# Patient Record
Sex: Male | Born: 1980 | Race: White | Hispanic: No | Marital: Married | Smoking: Never smoker
Health system: Southern US, Community
[De-identification: ages and names within clinical notes are randomized; demographics above are authoritative.]

## PROBLEM LIST (undated history)

## (undated) DIAGNOSIS — G43909 Migraine, unspecified, not intractable, without status migrainosus: Secondary | ICD-10-CM

## (undated) DIAGNOSIS — J45909 Unspecified asthma, uncomplicated: Secondary | ICD-10-CM

## (undated) HISTORY — PX: APPENDECTOMY: SHX54

## (undated) HISTORY — DX: Unspecified asthma, uncomplicated: J45.909

## (undated) HISTORY — PX: WISDOM TOOTH EXTRACTION: SHX21

## (undated) HISTORY — DX: Migraine, unspecified, not intractable, without status migrainosus: G43.909

## (undated) HISTORY — PX: HERNIA REPAIR: SHX51

---

## 2019-08-28 ENCOUNTER — Other Ambulatory Visit: Payer: Self-pay

## 2019-08-28 ENCOUNTER — Emergency Department
Admission: EM | Admit: 2019-08-28 | Discharge: 2019-08-28 | Disposition: A | Discharge status: Home / Self Care | Payer: BC Managed Care – PPO | Attending: Student | Admitting: Student

## 2019-08-28 ENCOUNTER — Emergency Department: Payer: BC Managed Care – PPO

## 2019-08-28 DIAGNOSIS — R0602 Shortness of breath: Secondary | ICD-10-CM

## 2019-08-28 DIAGNOSIS — R0781 Pleurodynia: Secondary | ICD-10-CM

## 2019-08-28 DIAGNOSIS — R0789 Other chest pain: Secondary | ICD-10-CM | POA: Diagnosis present

## 2019-08-28 LAB — CBC
HCT: 43.8 % (ref 39.0–52.0)
Hemoglobin: 16 g/dL (ref 13.0–17.0)
MCH: 32.7 pg (ref 26.0–34.0)
MCHC: 36.5 g/dL — ABNORMAL HIGH (ref 30.0–36.0)
MCV: 89.4 fL (ref 80.0–100.0)
Platelets: 321 10*3/uL (ref 150–400)
RBC: 4.9 MIL/uL (ref 4.22–5.81)
RDW: 12.4 % (ref 11.5–15.5)
WBC: 14 10*3/uL — ABNORMAL HIGH (ref 4.0–10.5)
nRBC: 0 % (ref 0.0–0.2)

## 2019-08-28 LAB — BASIC METABOLIC PANEL
Anion gap: 9 (ref 5–15)
BUN: 14 mg/dL (ref 6–20)
CO2: 24 mmol/L (ref 22–32)
Calcium: 9.2 mg/dL (ref 8.9–10.3)
Chloride: 103 mmol/L (ref 98–111)
Creatinine, Ser: 0.8 mg/dL (ref 0.61–1.24)
GFR calc Af Amer: >60 mL/min (ref >60)
GFR calc non Af Amer: >60 mL/min (ref >60)
Glucose, Bld: 118 mg/dL — ABNORMAL HIGH (ref 70–99)
Potassium: 3.9 mmol/L (ref 3.5–5.1)
Sodium: 136 mmol/L (ref 135–145)

## 2019-08-28 LAB — TROPONIN I (HIGH SENSITIVITY)
Troponin I (High Sensitivity): 3 ng/L (ref <18)
Troponin I (High Sensitivity): 3 ng/L (ref <18)

## 2019-08-28 LAB — FIBRIN DERIVATIVES D-DIMER (ARMC ONLY): Fibrin derivatives D-dimer (ARMC): 1029.59 ng/mL (FEU) — ABNORMAL HIGH (ref 0.00–499.00)

## 2019-08-28 MED ORDER — SODIUM CHLORIDE 0.9% FLUSH: 3.0000 mL | Freq: Once | INTRAVENOUS | Status: DC

## 2019-08-28 MED ORDER — KETOROLAC TROMETHAMINE 30 MG/ML IJ SOLN
INTRAMUSCULAR | Status: AC
  Administered 2019-08-28: 30 mg
  Filled 2019-08-28: qty 1

## 2019-08-28 MED ORDER — IOHEXOL 350 MG/ML SOLN
75.0000 mL | Freq: Once | INTRAVENOUS | Status: AC | PRN
  Administered 2019-08-28: 75 mL via INTRAVENOUS
  Filled 2019-08-28: qty 75

## 2019-08-28 MED ORDER — NAPROXEN 500 MG PO TABS: 500.0000 mg | ORAL_TABLET | Freq: Two times a day (BID) | ORAL | 0 refills | Status: AC

## 2019-08-28 MED ORDER — KETOROLAC TROMETHAMINE 15 MG/ML IJ SOLN
30.0000 mg | Freq: Once | INTRAMUSCULAR | Status: DC
  Filled 2019-08-28: qty 2

## 2019-08-28 MED ORDER — KETOROLAC TROMETHAMINE 15 MG/ML IJ SOLN
15.0000 mg | Freq: Once | INTRAMUSCULAR | Status: DC
  Filled 2019-08-28: qty 1

## 2019-08-28 NOTE — ED Provider Notes (Signed)
[facility] Emergency Department Provider Note  ____________________________________________   First MD Initiated Contact with Patient 08/28/19 1236     (approximate)  I have reviewed the triage vital signs and the nursing notes.  History  Chief Complaint Chest Pain    HPI Noah Mercado is a 38 y.o. male who presents to the emergency department for shortness of breath and pleuritic chest pain.  Patient was diagnosed with COVID on 10/19, completed his quarantine and felt he was doing well and improving.  Today, however, he developed chest pain with deep inspiration as well as shortness of breath. He reports the pain as sharp, moderate in severity, located across his chest, and exacerbated by a deep breath.  He denies any nausea, vomiting or diarrhea.  He denies any history of VTE.  He denies any history of HTN, HLD, DM.  He does not tobacco.   Past Medical Hx History reviewed. No pertinent past medical history.  Problem List There are no active problems to display for this patient.   Past Surgical Hx Past Surgical History:  Procedure Laterality Date  . APPENDECTOMY    . HERNIA REPAIR    . WISDOM TOOTH EXTRACTION      Medications Prior to Admission medications   Not on File    Allergies Patient has no known allergies.  Family Hx No family history on file.  Social Hx Social History   Tobacco Use  . Smoking status: Never Smoker  . Smokeless tobacco: Never Used  Substance Use Topics  . Alcohol use: Yes  . Drug use: Not Currently     Review of Systems  Constitutional: Negative for fever, chills. Eyes: Negative for visual changes. ENT: Negative for sore throat. Cardiovascular: + for chest pain. Respiratory: + for shortness of breath. Gastrointestinal: Negative for nausea, vomiting.  Genitourinary: Negative for dysuria. Musculoskeletal: Negative for leg swelling. Skin: Negative for rash. Neurological: Negative for for headaches.    Physical Exam  Vital Signs: ED Triage Vitals  Enc Vitals Group     BP 08/28/19 1100 118/72     Pulse Rate 08/28/19 1100 78     Resp 08/28/19 1100 18     Temp 08/28/19 1100 99.6 F (37.6 C)     Temp Source 08/28/19 1100 Oral     SpO2 08/28/19 1100 95 %     Weight 08/28/19 1101 243 lb (110.2 kg)     Height 08/28/19 1101 6\' 1"  (1.854 m)     Head Circumference --      Peak Flow --      Pain Score 08/28/19 1100 6     Pain Loc --      Pain Edu? --      Excl. in [place]? --     Constitutional: Alert and oriented.  Head: Normocephalic. Atraumatic. Eyes: Conjunctivae clear. Sclera anicteric. Nose: No congestion. No rhinorrhea. Mouth/Throat: Mucous membranes are moist.  Neck: No stridor.   Cardiovascular: Normal rate, regular rhythm. Extremities well perfused. Respiratory: Normal respiratory effort.  Lungs CTAB. Gastrointestinal: Soft. Non-tender. Non-distended.  Musculoskeletal: No lower extremity edema. No deformities. Neurologic:  Normal speech and language. No gross focal neurologic deficits are appreciated.  Skin: Skin is warm, dry and intact. No rash noted. Psychiatric: Mood and affect are appropriate for situation.  EKG  Personally reviewed.   Rate: 81 Rhythm: sinus Axis: LAD Intervals: WNL No acute ischemic changes No STEMI    Radiology  XR: IMPRESSION:  Negative for acute cardiopulmonary disease  CT: IMPRESSION:  Negative for pulmonary embolism. No acute abnormality in the chest.  Mild right lower lobe atelectasis.    Procedures  Procedure(s) performed (including critical care):  Procedures   Initial Impression / Assessment and Plan / ED Course  38 y.o. male who presents to the ED for pleuritic chest pain and shortness of breath.  Diagnosed with COVID on 10/19.  Ddx: pleurisy, MSK, ACS, PE.  Plan for labs, EKG, imaging.  EKG without acute ischemic changes, troponin x 2 negative.  D-dimer elevated, follow-up CT scan negative for PE.  Given  negative work-up, will plan for discharge.  Will provide Rx for trial of naproxen in case of pleurisy/MSK etiology.  Discussed return precautions.  Patient voices understanding of this and is comfortable plan and discharge.   Final Clinical Impression(s) / ED Diagnosis  Final diagnoses:  Pleuritic chest pain  Shortness of breath       Note:  This document was prepared using Dragon voice recognition software and may include unintentional dictation errors.   Miguel Aschoff., MD 08/28/19 [id number]

## 2019-08-28 NOTE — Discharge Instructions (Addendum)
Thank you for letting us take care of you in the emergency department today.   Please continue to take any regular, prescribed medications.   New medications we have prescribed:  - Naproxen  Please follow up with: - Your primary care doctor to review your ER visit and follow up on your symptoms.   Please return to the ER for any new or worsening symptoms.

## 2019-08-28 NOTE — ED Notes (Addendum)
See triage note  States he was COVID positive on 10/19  States this am  He developed some pain in chest with inspiration this am     Also states he feels SOB  Works in [employer]

## 2019-08-28 NOTE — ED Triage Notes (Signed)
Pt c/o substernal chest pain with SOB today with low grade temp 99. States he tested positive for covid, 10/19 and was under quarantine , states he recovered and had been doing fine til today.

## 2020-11-20 IMAGING — CR DG CHEST 2V
1 series · 2 of 2 positions shown · non-contrast
Comparison: None.

CLINICAL DATA: 38-year-old male with a history of chest pain

EXAM:
CHEST - 2 VIEW

[Series 1: dg chest 2 view · 0.14mm/px · 2 of 2 slices shown]
[im 1/2]
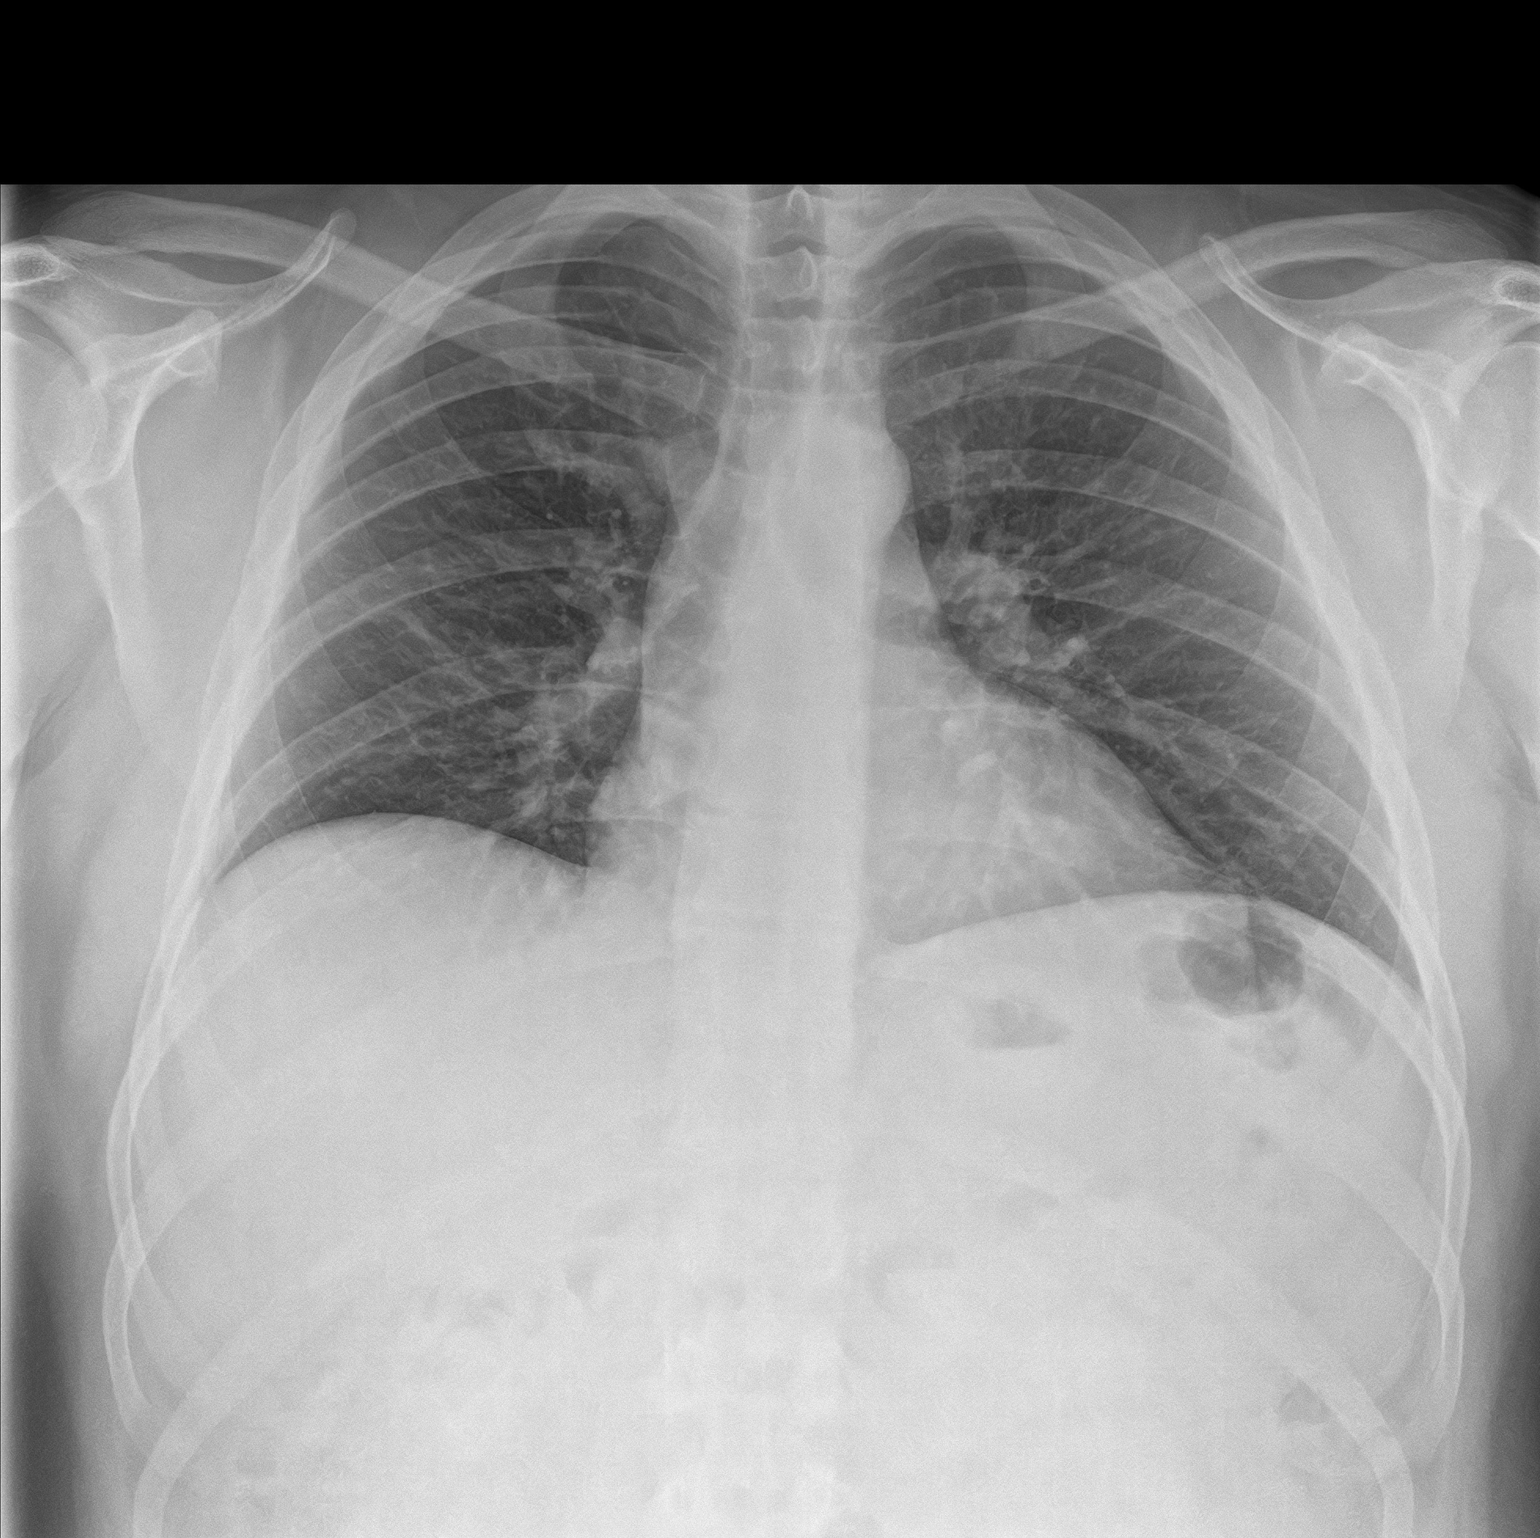
[im 2/2]
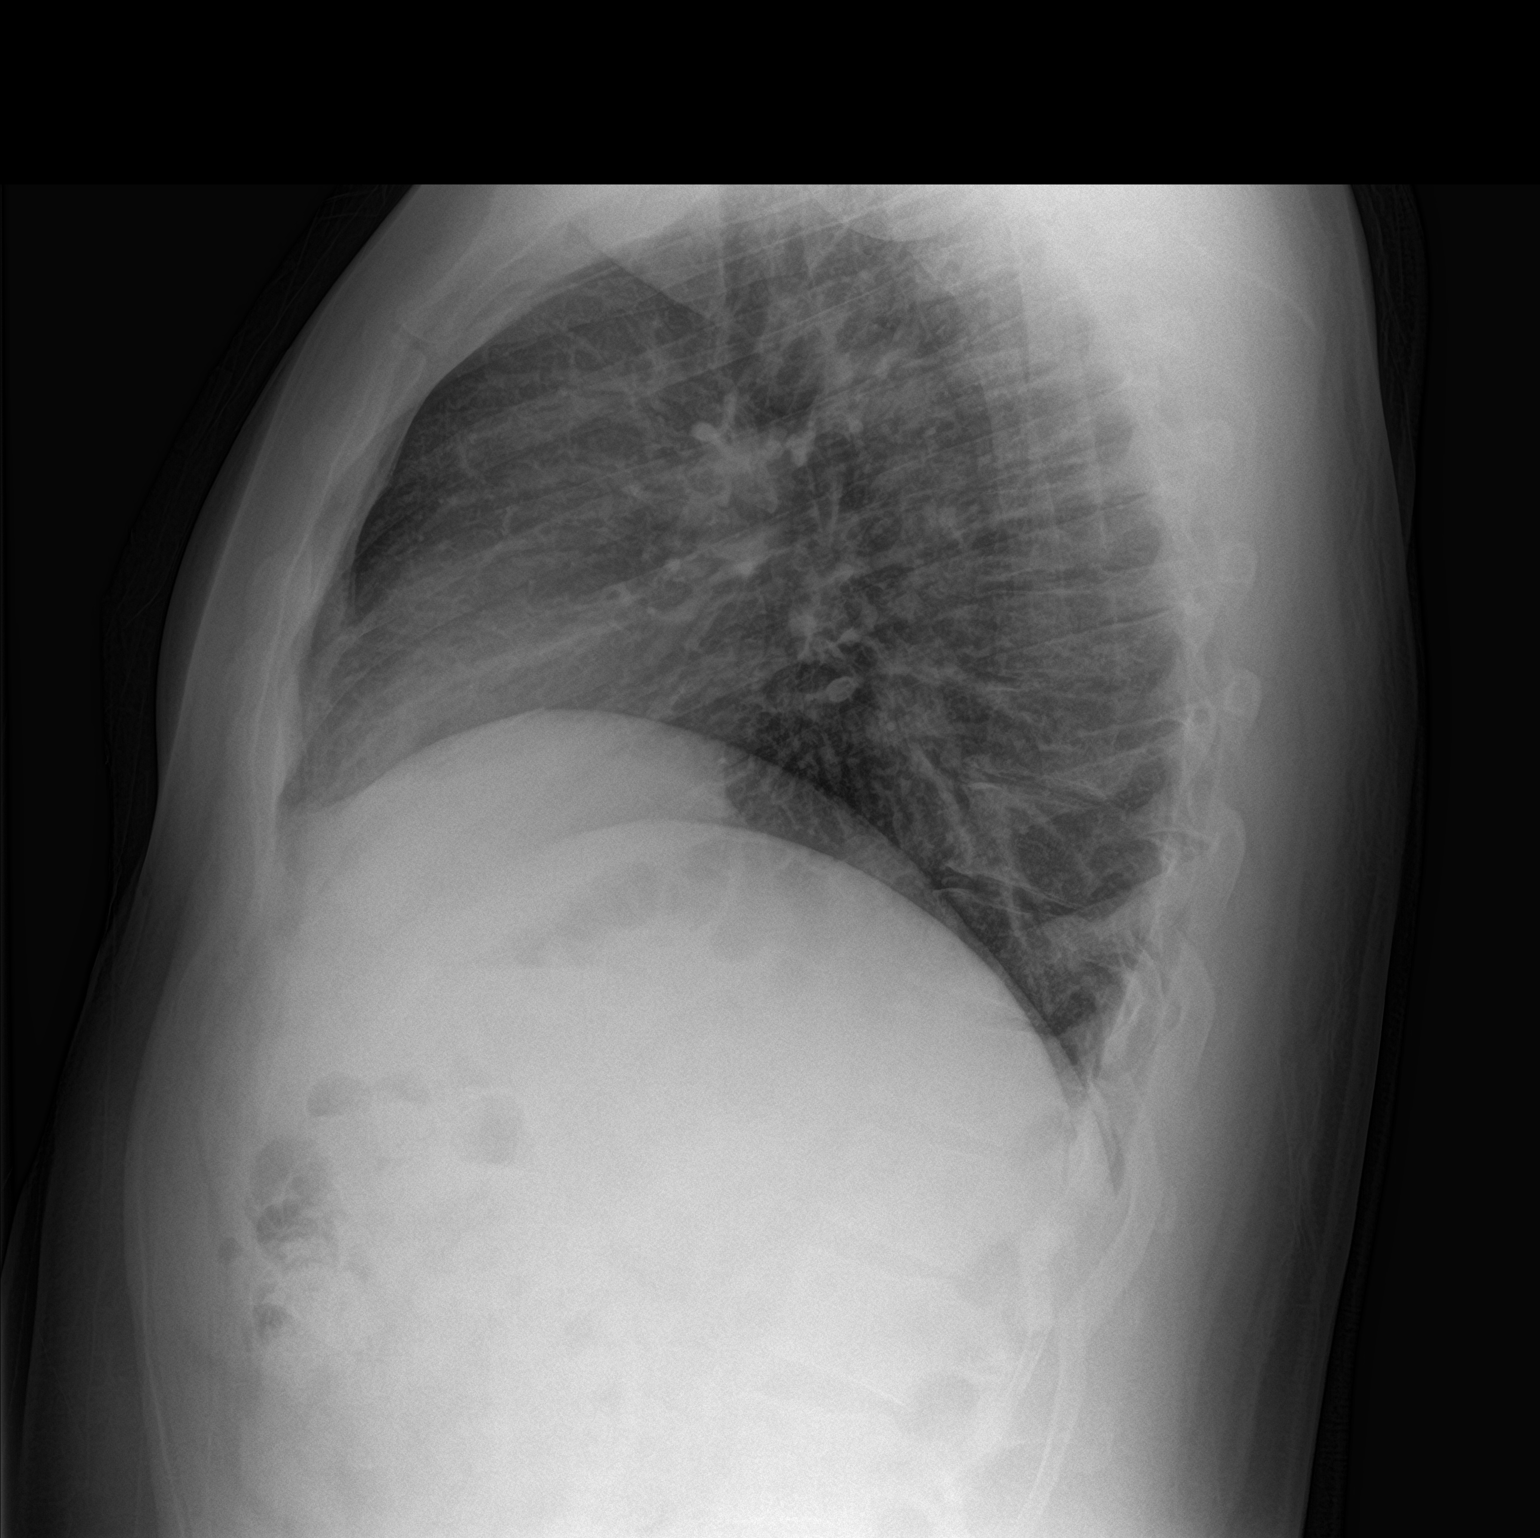

[2 of 2 positions shown; findings below may reference images not displayed]

FINDINGS: The heart size and mediastinal contours are within normal limits.
Both lungs are clear. The visualized skeletal structures are
unremarkable.
IMPRESSION: Negative for acute cardiopulmonary disease

## 2023-02-10 DIAGNOSIS — H50311 Intermittent monocular esotropia, right eye: Secondary | ICD-10-CM | POA: Diagnosis not present

## 2023-02-16 ENCOUNTER — Encounter: Payer: Self-pay | Admitting: Family Medicine

## 2023-02-16 ENCOUNTER — Ambulatory Visit (INDEPENDENT_AMBULATORY_CARE_PROVIDER_SITE_OTHER): Payer: BC Managed Care – PPO | Admitting: Family Medicine

## 2023-02-16 VITALS — BP 149/78 | HR 57 | Temp 98.6°F | Resp 18 | Ht 73.0 in | Wt 266.8 lb

## 2023-02-16 DIAGNOSIS — D229 Melanocytic nevi, unspecified: Secondary | ICD-10-CM | POA: Diagnosis not present

## 2023-02-16 DIAGNOSIS — G43B0 Ophthalmoplegic migraine, not intractable: Secondary | ICD-10-CM | POA: Diagnosis not present

## 2023-02-16 DIAGNOSIS — Z1159 Encounter for screening for other viral diseases: Secondary | ICD-10-CM

## 2023-02-16 DIAGNOSIS — Z0000 Encounter for general adult medical examination without abnormal findings: Secondary | ICD-10-CM

## 2023-02-16 DIAGNOSIS — Z13220 Encounter for screening for lipoid disorders: Secondary | ICD-10-CM | POA: Diagnosis not present

## 2023-02-16 DIAGNOSIS — R7302 Impaired glucose tolerance (oral): Secondary | ICD-10-CM

## 2023-02-16 DIAGNOSIS — Z136 Encounter for screening for cardiovascular disorders: Secondary | ICD-10-CM | POA: Diagnosis not present

## 2023-02-16 DIAGNOSIS — Z7689 Persons encountering health services in other specified circumstances: Secondary | ICD-10-CM

## 2023-02-16 NOTE — Progress Notes (Signed)
New Patient Office Visit  Subjective    Patient ID: Noah Mercado, male    DOB: 04/20/1981  Age: 42 y.o. MRN: [id number]  CC:  Chief Complaint  Patient presents with   Establish Care    Skin Spots     HPI Sourish Allender presents to establish care. Pt is new to me.  Pt is wanting physical today.  Migraine Pt is new to me. Upon entering the room today, he has sunglasses on today. He reports he's been experiencing pain to light since the age of 70. He reports while in church, he had vision disturbances. He was seen by neurologist and had studies done. 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 He says he was in [place] at the time. He reports the headaches would be around the eyes and wrap around his head.  During this time, he would try to avoid being out in bright lights and sunlight. He works at home for the last 5 years. 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 He reports he doesn't know the triggers. He reports using dimmer lights. He reports every other month, he may have debilitating migraine lasting a few hours but never days. The longest one was 13 hours and it was 8 years ago. He does report he is seeing an ophthalmologist for right strabismus. He also request a form to be completed for tint on his windows due to hx of migraines causing photophobia. He lived in [place] before coming here and didn't need to have this permit but [place] requires tint to be a certain level, unless he has a medical condition.  Spots He has a spot on his abdomen that has been present for the last 10 years. He thought it was a freckle. He also has some blotches on his legs. He would like to see dermatology to be checked. He has some freckles on his back that he reports since he doesn't go out in the sun due to migraines, this concerned him.     No outpatient encounter medications on file as of 02/16/2023.   No facility-administered encounter medications on file as  of 02/16/2023.    Past Medical History:  Diagnosis Date   Asthma    Migraine     Past Surgical History:  Procedure Laterality Date   APPENDECTOMY     HERNIA REPAIR     WISDOM TOOTH EXTRACTION      Family History  Problem Relation Age of Onset   Multiple sclerosis Mother    Celiac disease Mother    Hypertension Father     Social History   Socioeconomic History   Marital status: Married    Spouse name: Not on file   Number of children: 4   Years of education: Not on file   Highest education level: Not on file  Occupational History   Not on file  Tobacco Use   Smoking status: Never    Passive exposure: Never   Smokeless tobacco: Never  Vaping Use   Vaping Use: Never used  Substance and Sexual Activity   Alcohol use: Yes    Comment: occassionally   Drug use: Never   Sexual activity: Yes  Other Topics Concern   Not on file  Social History Narrative   Not on file   Social Determinants of Health   Financial Resource Strain: Not on file  Food Insecurity: Not on file  Transportation Needs: Not on file  Physical Activity: Not on  file  Stress: Not on file  Social Connections: Not on file  Intimate Partner Violence: Not on file    Review of Systems  Eyes:  Positive for photophobia.  Neurological:  Positive for headaches.  All other systems reviewed and are negative.       Objective    BP (!) 149/78   Pulse (!) 57   Temp 98.6 F (37 C) (Oral)   Resp 18   Ht 6\' 1"  (1.854 m)   Wt 266 lb 12.8 oz (121 kg)   SpO2 97%   BMI 35.20 kg/m   Physical Exam Vitals and nursing note reviewed.  Constitutional:      Appearance: He is obese.  HENT:     Head: Normocephalic and atraumatic.     Right Ear: Tympanic membrane, ear canal and external ear normal.     Left Ear: Tympanic membrane, ear canal and external ear normal.  Cardiovascular:     Rate and Rhythm: Normal rate and regular rhythm.     Pulses: Normal pulses.     Heart sounds: Normal heart sounds.   Musculoskeletal:        General: Normal range of motion.  Skin:    General: Skin is warm.     Capillary Refill: Capillary refill takes less than 2 seconds.  Neurological:     General: No focal deficit present.     Mental Status: Mental status is at baseline.      Assessment & Plan:   Problem List Items Addressed This Visit   None  Annual physical exam  Encounter to establish care with new doctor  Impaired glucose tolerance -     CBC with Differential/Platelet -     Comprehensive metabolic panel -     Hemoglobin A1c  Encounter for lipid screening for cardiovascular disease -     Lipid panel  Ophthalmoplegic migraine, not intractable -     Ambulatory referral to Neurology  Multiple atypical skin moles -     Ambulatory referral to Dermatology  Need for hepatitis C screening test -     Hepatitis C antibody  Screening for viral disease -     HIV Antibody (routine testing w rflx)  Screening labs Refer to Dermatology for skin survey and evaluation of atypical moles on back Refer to Neurology for further evaluation of migraines. No follow-ups on file.   Suzan Slick, MD

## 2023-02-17 LAB — CBC WITH DIFFERENTIAL/PLATELET
Basophils Absolute: 0 10*3/uL (ref 0.0–0.2)
Basos: 1 %
EOS (ABSOLUTE): 0.1 10*3/uL (ref 0.0–0.4)
Eos: 3 %
Hematocrit: 46.4 % (ref 37.5–51.0)
Hemoglobin: 16.4 g/dL (ref 13.0–17.7)
Immature Grans (Abs): 0 10*3/uL (ref 0.0–0.1)
Immature Granulocytes: 0 %
Lymphocytes Absolute: 1.8 10*3/uL (ref 0.7–3.1)
Lymphs: 35 %
MCH: 32.3 pg (ref 26.6–33.0)
MCHC: 35.3 g/dL (ref 31.5–35.7)
MCV: 92 fL (ref 79–97)
Monocytes Absolute: 0.5 10*3/uL (ref 0.1–0.9)
Monocytes: 9 %
Neutrophils Absolute: 2.7 10*3/uL (ref 1.4–7.0)
Neutrophils: 52 %
Platelets: 259 10*3/uL (ref 150–450)
RBC: 5.07 x10E6/uL (ref 4.14–5.80)
RDW: 12.7 % (ref 11.6–15.4)
WBC: 5.1 10*3/uL (ref 3.4–10.8)

## 2023-02-17 LAB — LIPID PANEL
Chol/HDL Ratio: 6.3 ratio — ABNORMAL HIGH (ref 0.0–5.0)
Cholesterol, Total: 209 mg/dL — ABNORMAL HIGH (ref 100–199)
HDL: 33 mg/dL — ABNORMAL LOW (ref >39)
LDL Chol Calc (NIH): 98 mg/dL (ref 0–99)
Triglycerides: 465 mg/dL — ABNORMAL HIGH (ref 0–149)
VLDL Cholesterol Cal: 78 mg/dL — ABNORMAL HIGH (ref 5–40)

## 2023-02-17 LAB — COMPREHENSIVE METABOLIC PANEL
ALT: 110 IU/L — ABNORMAL HIGH (ref 0–44)
AST: 39 IU/L (ref 0–40)
Albumin/Globulin Ratio: 1.7 (ref 1.2–2.2)
Albumin: 4.8 g/dL (ref 4.1–5.1)
Alkaline Phosphatase: 79 IU/L (ref 44–121)
BUN/Creatinine Ratio: 18 (ref 9–20)
BUN: 17 mg/dL (ref 6–24)
Bilirubin Total: 0.4 mg/dL (ref 0.0–1.2)
CO2: 19 mmol/L — ABNORMAL LOW (ref 20–29)
Calcium: 10 mg/dL (ref 8.7–10.2)
Chloride: 102 mmol/L (ref 96–106)
Creatinine, Ser: 0.95 mg/dL (ref 0.76–1.27)
Globulin, Total: 2.8 g/dL (ref 1.5–4.5)
Glucose: 86 mg/dL (ref 70–99)
Potassium: 4.6 mmol/L (ref 3.5–5.2)
Sodium: 140 mmol/L (ref 134–144)
Total Protein: 7.6 g/dL (ref 6.0–8.5)
eGFR: 104 mL/min/{1.73_m2} (ref >59)

## 2023-02-17 LAB — HEMOGLOBIN A1C
Est. average glucose Bld gHb Est-mCnc: 100 mg/dL
Hgb A1c MFr Bld: 5.1 % (ref 4.8–5.6)

## 2023-02-17 LAB — HIV ANTIBODY (ROUTINE TESTING W REFLEX): HIV Screen 4th Generation wRfx: NONREACTIVE

## 2023-02-17 LAB — HEPATITIS C ANTIBODY: Hep C Virus Ab: NONREACTIVE

## 2023-02-22 ENCOUNTER — Encounter: Payer: Self-pay | Admitting: Neurology

## 2023-07-05 NOTE — Progress Notes (Deleted)
NEUROLOGY CONSULTATION NOTE  Moayad Mantle MRN: [id number] DOB: 1981/03/04  Referring provider: Sundra Aland, MD Primary care provider: Sundra Aland, MD  Reason for consult:  migraines  Assessment/Plan:   ***   Subjective:  Noah Mercado is a 42 year old male with asthma who presents for migraines.  History supplemented by referring provider's note.  He has had photosensitivity since age 28.  ***.  He avoids bright light as much as possible and otherwise wears sunglasses.  ***  Past NSAIDS/analgesics:  Toradol Past abortive triptans:  *** Past abortive ergotamine:  *** Past muscle relaxants:  *** Past anti-emetic:  *** Past antihypertensive medications:  *** Past antidepressant medications:  *** Past anticonvulsant medications:  *** Past anti-CGRP:  *** Past vitamins/Herbal/Supplements:  *** Past antihistamines/decongestants:  *** Other past therapies:  ***  Current NSAIDS/analgesics:  *** Current triptans:  *** Current ergotamine:  *** Current anti-emetic:  *** Current muscle relaxants:  *** Current Antihypertensive medications:  *** Current Antidepressant medications:  *** Current Anticonvulsant medications:  *** Current anti-CGRP:  *** Current Vitamins/Herbal/Supplements:  *** Current Antihistamines/Decongestants:  *** Other therapy:  *** Birth control:  *** Other medications:  ***   Caffeine:  *** Alcohol:  *** Smoker:  *** Diet:  *** Exercise:  *** Depression:  ***; Anxiety:  *** Other pain:  *** Sleep hygiene:  *** Family history of headache:  ***      PAST MEDICAL HISTORY: Past Medical History:  Diagnosis Date   Asthma    Migraine     PAST SURGICAL HISTORY: Past Surgical History:  Procedure Laterality Date   APPENDECTOMY     HERNIA REPAIR     WISDOM TOOTH EXTRACTION      MEDICATIONS: No current outpatient medications on file prior to visit.   No current facility-administered medications on file prior to visit.     ALLERGIES: No Known Allergies  FAMILY HISTORY: Family History  Problem Relation Age of Onset   Multiple sclerosis Mother    Celiac disease Mother    Hypertension Father     Objective:  *** General: No acute distress.  Patient appears well-groomed.   Head:  Normocephalic/atraumatic Eyes:  fundi examined but not visualized Neck: supple, no paraspinal tenderness, full range of motion Back: No paraspinal tenderness Heart: regular rate and rhythm Lungs: Clear to auscultation bilaterally. Vascular: No carotid bruits. Neurological Exam: Mental status: alert and oriented to person, place, and time, speech fluent and not dysarthric, language intact. Cranial nerves: CN I: not tested CN II: pupils equal, round and reactive to light, visual fields intact CN III, IV, VI:  full range of motion, no nystagmus, no ptosis CN V: facial sensation intact. CN VII: upper and lower face symmetric CN VIII: hearing intact CN IX, X: gag intact, uvula midline CN XI: sternocleidomastoid and trapezius muscles intact CN XII: tongue midline Bulk & Tone: normal, no fasciculations. Motor:  muscle strength 5/5 throughout Sensation:  Pinprick, temperature and vibratory sensation intact. Deep Tendon Reflexes:  2+ throughout,  toes downgoing.   Finger to nose testing:  Without dysmetria.   Heel to shin:  Without dysmetria.   Gait:  Normal station and stride.  Romberg negative.    Thank you for allowing me to take part in the care of this patient.  Shon Millet, DO  CC: ***

## 2023-07-06 ENCOUNTER — Ambulatory Visit: Payer: BC Managed Care – PPO | Admitting: Neurology

## 2024-02-17 ENCOUNTER — Encounter: Payer: BC Managed Care – PPO | Admitting: Family Medicine

## 2024-03-29 ENCOUNTER — Encounter: Admitting: Family Medicine

## 2024-04-24 ENCOUNTER — Encounter: Admitting: Family Medicine

## 2024-05-10 ENCOUNTER — Ambulatory Visit: Admitting: Family Medicine

## 2024-06-02 DIAGNOSIS — S0502XA Injury of conjunctiva and corneal abrasion without foreign body, left eye, initial encounter: Secondary | ICD-10-CM | POA: Diagnosis not present

## 2024-06-05 DIAGNOSIS — S0502XD Injury of conjunctiva and corneal abrasion without foreign body, left eye, subsequent encounter: Secondary | ICD-10-CM | POA: Diagnosis not present

## 2024-06-09 ENCOUNTER — Encounter (HOSPITAL_COMMUNITY): Admission: RE | Payer: Self-pay | Source: Home / Self Care

## 2024-06-09 ENCOUNTER — Ambulatory Visit (HOSPITAL_COMMUNITY): Admission: RE | Admit: 2024-06-09 | Source: Home / Self Care | Admitting: Ophthalmology

## 2024-06-09 SURGERY — REPAIR STRABISMUS
Anesthesia: General | Laterality: Bilateral

## 2024-07-05 ENCOUNTER — Ambulatory Visit (INDEPENDENT_AMBULATORY_CARE_PROVIDER_SITE_OTHER): Admitting: Family Medicine

## 2024-07-05 VITALS — BP 111/67 | HR 52 | Temp 98.2°F | Resp 18 | Ht 73.0 in | Wt 253.3 lb

## 2024-07-05 DIAGNOSIS — R7302 Impaired glucose tolerance (oral): Secondary | ICD-10-CM

## 2024-07-05 DIAGNOSIS — Z13220 Encounter for screening for lipoid disorders: Secondary | ICD-10-CM

## 2024-07-05 DIAGNOSIS — Z0000 Encounter for general adult medical examination without abnormal findings: Secondary | ICD-10-CM

## 2024-07-05 DIAGNOSIS — Z136 Encounter for screening for cardiovascular disorders: Secondary | ICD-10-CM | POA: Diagnosis not present

## 2024-07-05 NOTE — Progress Notes (Signed)
 Complete physical exam  Patient: Noah Mercado   DOB: 1981-09-18   43 y.o. Male  MRN: [id number]  Subjective:    Chief Complaint  Patient presents with   Annual Exam    Belinda Bringhurst is a 43 y.o. male who presents today for a complete physical exam. He reports consuming a general diet. The patient does not participate in regular exercise at present. He generally feels fairly well. He reports sleeping fairly well. He does not have additional problems to discuss today.    Most recent fall risk assessment:    07/05/2024    2:40 PM  Fall Risk   Falls in the past year? 0  Number falls in past yr: 0  Injury with Fall? 0  Risk for fall due to : No Fall Risks  Follow up Falls evaluation completed     Most recent depression screenings:    07/05/2024    2:40 PM 02/16/2023    1:33 PM  PHQ 2/9 Scores  PHQ - 2 Score 0 0  PHQ- 9 Score 0 2    Vision:Within last year  Patient Active Problem List   Diagnosis Date Noted   Ophthalmoplegic migraine, not intractable 02/16/2023   Past Medical History:  Diagnosis Date   Asthma    Migraine    Past Surgical History:  Procedure Laterality Date   APPENDECTOMY     HERNIA REPAIR     WISDOM TOOTH EXTRACTION     Social History   Socioeconomic History   Marital status: Married    Spouse name: Not on file   Number of children: 4   Years of education: Not on file   Highest education level: Some college, no degree  Occupational History   Not on file  Tobacco Use   Smoking status: Never    Passive exposure: Never   Smokeless tobacco: Never  Vaping Use   Vaping status: Never Used  Substance and Sexual Activity   Alcohol use: Yes    Comment: occassionally   Drug use: Never   Sexual activity: Yes  Other Topics Concern   Not on file  Social History Narrative   Not on file   Social Drivers of Health   Financial Resource Strain: Low Risk  (07/05/2024)   Overall Financial Resource Strain (CARDIA)    Difficulty of Paying  Living Expenses: Not hard at all  Food Insecurity: No Food Insecurity (07/05/2024)   Hunger Vital Sign    Worried About Running Out of Food in the Last Year: Never true    Ran Out of Food in the Last Year: Never true  Transportation Needs: No Transportation Needs (07/05/2024)   PRAPARE - Administrator, Civil Service (Medical): No    Lack of Transportation (Non-Medical): No  Physical Activity: Insufficiently Active (07/05/2024)   Exercise Vital Sign    Days of Exercise per Week: 3 days    Minutes of Exercise per Session: 20 min  Stress: Stress Concern Present (07/05/2024)   [employer] of Occupational Health - Occupational Stress Questionnaire    Feeling of Stress: To some extent  Social Connections: Unknown (07/05/2024)   Social Connection and Isolation Panel    Frequency of Communication with Friends and Family: Patient declined    Frequency of Social Gatherings with Friends and Family: Patient declined    Attends Religious Services: Patient declined    Database administrator or Organizations: Yes    Attends Banker Meetings: Patient declined  Marital Status: Married  Catering manager Violence: Not on file   Family History  Problem Relation Age of Onset   Multiple sclerosis Mother    Celiac disease Mother    Hypertension Father    No Known Allergies    Patient Care Team: Colette Torrence GRADE, MD as PCP - General (Family Medicine)   No outpatient medications prior to visit.   No facility-administered medications prior to visit.    Review of Systems  All other systems reviewed and are negative.         Objective:     BP 111/67   Pulse (!) 52   Temp 98.2 F (36.8 C) (Oral)   Resp 18   Ht 6' 1 (1.854 m)   Wt 253 lb 4.8 oz (114.9 kg)   SpO2 97%   BMI 33.42 kg/m  BP Readings from Last 3 Encounters:  07/05/24 111/67  02/16/23 (!) 149/78  08/28/19 123/73      Physical Exam Vitals and nursing note reviewed.  Constitutional:       Appearance: Normal appearance. He is normal weight.  HENT:     Head: Normocephalic and atraumatic.     Right Ear: Tympanic membrane, ear canal and external ear normal.     Left Ear: Tympanic membrane, ear canal and external ear normal.     Nose: Nose normal.     Mouth/Throat:     Mouth: Mucous membranes are moist.     Pharynx: Oropharynx is clear.  Eyes:     Conjunctiva/sclera: Conjunctivae normal.     Pupils: Pupils are equal, round, and reactive to light.  Cardiovascular:     Rate and Rhythm: Normal rate and regular rhythm.     Pulses: Normal pulses.     Heart sounds: Normal heart sounds.  Pulmonary:     Effort: Pulmonary effort is normal.     Breath sounds: Normal breath sounds.  Abdominal:     General: Abdomen is flat. Bowel sounds are normal.  Skin:    General: Skin is warm.     Capillary Refill: Capillary refill takes less than 2 seconds.  Neurological:     General: No focal deficit present.     Mental Status: He is alert and oriented to person, place, and time. Mental status is at baseline.  Psychiatric:        Mood and Affect: Mood normal.        Behavior: Behavior normal.        Thought Content: Thought content normal.        Judgment: Judgment normal.     No results found for any visits on 07/05/24. Last CBC Lab Results  Component Value Date   WBC 5.1 02/16/2023   HGB 16.4 02/16/2023   HCT 46.4 02/16/2023   MCV 92 02/16/2023   MCH 32.3 02/16/2023   RDW 12.7 02/16/2023   PLT 259 02/16/2023   Last metabolic panel Lab Results  Component Value Date   GLUCOSE 86 02/16/2023   NA 140 02/16/2023   K 4.6 02/16/2023   CL 102 02/16/2023   CO2 19 (L) 02/16/2023   BUN 17 02/16/2023   CREATININE 0.95 02/16/2023   EGFR 104 02/16/2023   CALCIUM 10.0 02/16/2023   PROT 7.6 02/16/2023   ALBUMIN 4.8 02/16/2023   LABGLOB 2.8 02/16/2023   AGRATIO 1.7 02/16/2023   BILITOT 0.4 02/16/2023   ALKPHOS 79 02/16/2023   AST 39 02/16/2023   ALT 110 (H) 02/16/2023    ANIONGAP 9  08/28/2019   Last lipids Lab Results  Component Value Date   CHOL 209 (H) 02/16/2023   HDL 33 (L) 02/16/2023   LDLCALC 98 02/16/2023   TRIG 465 (H) 02/16/2023   CHOLHDL 6.3 (H) 02/16/2023   Last hemoglobin A1c Lab Results  Component Value Date   HGBA1C 5.1 02/16/2023        Assessment & Plan:    Routine Health Maintenance and Physical Exam   There is no immunization history on file for this patient.  Health Maintenance  Topic Date Due   COVID-19 Vaccine (1) Never done   DTaP/Tdap/Td (1 - Tdap) Never done   Hepatitis B Vaccines 19-59 Average Risk (1 of 3 - 19+ 3-dose series) Never done   HPV VACCINES (1 - Risk 3-dose SCDM series) Never done   Influenza Vaccine  Never done   Hepatitis C Screening  Completed   HIV Screening  Completed   Pneumococcal Vaccine  Aged Out   Meningococcal B Vaccine  Aged Out    Discussed health benefits of physical activity, and encouraged him to engage in regular exercise appropriate for his age and condition.  Problem List Items Addressed This Visit   None Visit Diagnoses       Annual physical exam    -  Primary     Encounter for lipid screening for cardiovascular disease       Relevant Orders   Lipid panel     Impaired glucose tolerance       Relevant Orders   CBC with Differential/Platelet   Comprehensive metabolic panel with GFR   Hemoglobin A1c      No follow-ups on file. Annual physical exam  Encounter for lipid screening for cardiovascular disease -     Lipid panel  Impaired glucose tolerance -     CBC with Differential/Platelet -     Comprehensive metabolic panel with GFR -     Hemoglobin A1c   Screening labs See in 1 year sooner prn    Torrence CINDERELLA Barrier, MD

## 2024-07-06 ENCOUNTER — Ambulatory Visit: Payer: Self-pay | Admitting: Family Medicine

## 2024-07-06 LAB — CBC WITH DIFFERENTIAL/PLATELET
Basophils Absolute: 0 x10E3/uL (ref 0.0–0.2)
Basos: 1 %
EOS (ABSOLUTE): 0.1 x10E3/uL (ref 0.0–0.4)
Eos: 1 %
Hematocrit: 43.7 % (ref 37.5–51.0)
Hemoglobin: 14.9 g/dL (ref 13.0–17.7)
Immature Grans (Abs): 0 x10E3/uL (ref 0.0–0.1)
Immature Granulocytes: 0 %
Lymphocytes Absolute: 1.4 x10E3/uL (ref 0.7–3.1)
Lymphs: 22 %
MCH: 32.5 pg (ref 26.6–33.0)
MCHC: 34.1 g/dL (ref 31.5–35.7)
MCV: 95 fL (ref 79–97)
Monocytes Absolute: 0.5 x10E3/uL (ref 0.1–0.9)
Monocytes: 8 %
Neutrophils Absolute: 4.4 x10E3/uL (ref 1.4–7.0)
Neutrophils: 68 %
Platelets: 226 x10E3/uL (ref 150–450)
RBC: 4.58 x10E6/uL (ref 4.14–5.80)
RDW: 12.7 % (ref 11.6–15.4)
WBC: 6.5 x10E3/uL (ref 3.4–10.8)

## 2024-07-06 LAB — COMPREHENSIVE METABOLIC PANEL WITH GFR
ALT: 64 IU/L — ABNORMAL HIGH (ref 0–44)
AST: 35 IU/L (ref 0–40)
Albumin: 4.7 g/dL (ref 4.1–5.1)
Alkaline Phosphatase: 74 IU/L (ref 47–123)
BUN/Creatinine Ratio: 19 (ref 9–20)
BUN: 20 mg/dL (ref 6–24)
Bilirubin Total: 1 mg/dL (ref 0.0–1.2)
CO2: 21 mmol/L (ref 20–29)
Calcium: 9.6 mg/dL (ref 8.7–10.2)
Chloride: 104 mmol/L (ref 96–106)
Creatinine, Ser: 1.06 mg/dL (ref 0.76–1.27)
Globulin, Total: 2.4 g/dL (ref 1.5–4.5)
Glucose: 74 mg/dL (ref 70–99)
Potassium: 4.6 mmol/L (ref 3.5–5.2)
Sodium: 141 mmol/L (ref 134–144)
Total Protein: 7.1 g/dL (ref 6.0–8.5)
eGFR: 90 mL/min/1.73 (ref >59)

## 2024-07-06 LAB — LIPID PANEL
Chol/HDL Ratio: 4.6 ratio (ref 0.0–5.0)
Cholesterol, Total: 188 mg/dL (ref 100–199)
HDL: 41 mg/dL (ref >39)
LDL Chol Calc (NIH): 132 mg/dL — ABNORMAL HIGH (ref 0–99)
Triglycerides: 83 mg/dL (ref 0–149)
VLDL Cholesterol Cal: 15 mg/dL (ref 5–40)

## 2024-07-06 LAB — HEMOGLOBIN A1C
Est. average glucose Bld gHb Est-mCnc: 100 mg/dL
Hgb A1c MFr Bld: 5.1 % (ref 4.8–5.6)

## 2024-09-06 ENCOUNTER — Encounter: Admitting: Family Medicine

## 2024-11-29 ENCOUNTER — Encounter: Admitting: Family Medicine
